# Patient Record
Sex: Female | Born: 1955 | Race: White | Hispanic: No | Marital: Married | Smoking: Never smoker
Health system: Southern US, Community
[De-identification: ages and names within clinical notes are randomized; demographics above are authoritative.]

## PROBLEM LIST (undated history)

## (undated) DIAGNOSIS — J189 Pneumonia, unspecified organism: Secondary | ICD-10-CM

## (undated) DIAGNOSIS — N390 Urinary tract infection, site not specified: Secondary | ICD-10-CM

## (undated) HISTORY — DX: Pneumonia, unspecified organism: J18.9

## (undated) HISTORY — DX: Urinary tract infection, site not specified: N39.0

---

## 1997-02-26 DIAGNOSIS — J189 Pneumonia, unspecified organism: Secondary | ICD-10-CM

## 1997-02-26 HISTORY — DX: Pneumonia, unspecified organism: J18.9

## 2005-11-26 HISTORY — PX: BUNIONECTOMY: SHX129

## 2007-03-05 ENCOUNTER — Other Ambulatory Visit: Admission: RE | Admit: 2007-03-05 | Discharge: 2007-03-05 | Payer: Self-pay | Admitting: Family Medicine

## 2007-03-12 ENCOUNTER — Ambulatory Visit: Payer: Self-pay | Admitting: Family Medicine

## 2007-03-12 DIAGNOSIS — N12 Tubulo-interstitial nephritis, not specified as acute or chronic: Secondary | ICD-10-CM | POA: Insufficient documentation

## 2007-03-12 LAB — CONVERTED CEMR LAB
Bilirubin Urine: NEGATIVE
Ketones, urine, test strip: NEGATIVE
Nitrite: NEGATIVE
Specific Gravity, Urine: 1.01

## 2007-03-14 ENCOUNTER — Telehealth: Payer: Self-pay | Admitting: *Deleted

## 2007-03-14 LAB — CONVERTED CEMR LAB
EBV VCA IgG: 3.79 — ABNORMAL HIGH
EBV VCA IgM: 0.84

## 2007-03-20 ENCOUNTER — Encounter: Payer: Self-pay | Admitting: Family Medicine

## 2007-04-10 ENCOUNTER — Encounter: Payer: Self-pay | Admitting: Family Medicine

## 2007-05-02 ENCOUNTER — Encounter: Payer: Self-pay | Admitting: Family Medicine

## 2007-05-14 ENCOUNTER — Ambulatory Visit (HOSPITAL_COMMUNITY): Admission: RE | Admit: 2007-05-14 | Discharge: 2007-05-14 | Payer: Self-pay | Admitting: Urology

## 2007-10-08 ENCOUNTER — Telehealth: Payer: Self-pay | Admitting: Family Medicine

## 2007-10-10 ENCOUNTER — Ambulatory Visit: Payer: Self-pay | Admitting: Family Medicine

## 2007-10-10 LAB — CONVERTED CEMR LAB
Nitrite: NEGATIVE
Specific Gravity, Urine: 1.02
Urobilinogen, UA: 0.2

## 2007-10-11 ENCOUNTER — Encounter: Payer: Self-pay | Admitting: Family Medicine

## 2007-10-17 ENCOUNTER — Ambulatory Visit: Payer: Self-pay | Admitting: Family Medicine

## 2007-10-17 DIAGNOSIS — N3000 Acute cystitis without hematuria: Secondary | ICD-10-CM | POA: Insufficient documentation

## 2007-10-17 LAB — CONVERTED CEMR LAB
Bilirubin Urine: NEGATIVE
Ketones, urine, test strip: NEGATIVE
Nitrite: NEGATIVE
Protein, U semiquant: NEGATIVE
Urobilinogen, UA: 0.2

## 2008-10-08 ENCOUNTER — Ambulatory Visit: Payer: Self-pay | Admitting: Family Medicine

## 2008-10-08 DIAGNOSIS — M2240 Chondromalacia patellae, unspecified knee: Secondary | ICD-10-CM

## 2008-10-18 ENCOUNTER — Telehealth: Payer: Self-pay | Admitting: Family Medicine

## 2008-10-18 DIAGNOSIS — M25569 Pain in unspecified knee: Secondary | ICD-10-CM

## 2008-10-19 ENCOUNTER — Telehealth: Payer: Self-pay | Admitting: Family Medicine

## 2008-10-26 ENCOUNTER — Encounter: Payer: Self-pay | Admitting: Family Medicine

## 2008-12-10 ENCOUNTER — Encounter: Payer: Self-pay | Admitting: Family Medicine

## 2008-12-15 ENCOUNTER — Encounter: Payer: Self-pay | Admitting: Family Medicine

## 2008-12-31 ENCOUNTER — Ambulatory Visit (HOSPITAL_BASED_OUTPATIENT_CLINIC_OR_DEPARTMENT_OTHER): Admission: RE | Admit: 2008-12-31 | Discharge: 2008-12-31 | Payer: Self-pay | Admitting: Specialist

## 2009-01-06 ENCOUNTER — Encounter: Payer: Self-pay | Admitting: Family Medicine

## 2009-01-10 ENCOUNTER — Encounter (INDEPENDENT_AMBULATORY_CARE_PROVIDER_SITE_OTHER): Payer: Self-pay | Admitting: *Deleted

## 2009-04-22 IMAGING — NM NM VCUG
2 series · 7 of 7 positions shown · non-contrast
Comparison: none

CLINICAL DATA: Left renal atrophy, ureterocele.

[Series 1: dy vcug · 9.39mm/px · 6 of 63 frames shown]
[frame 6/63]
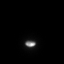
[frame 16/63]
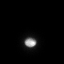
[frame 27/63]
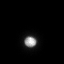
[frame 37/63]
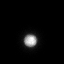
[frame 48/63]
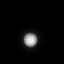
[frame 58/63]
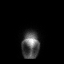

[Series 1: st static image · 1 of 1 slices shown]
[im 1/1]
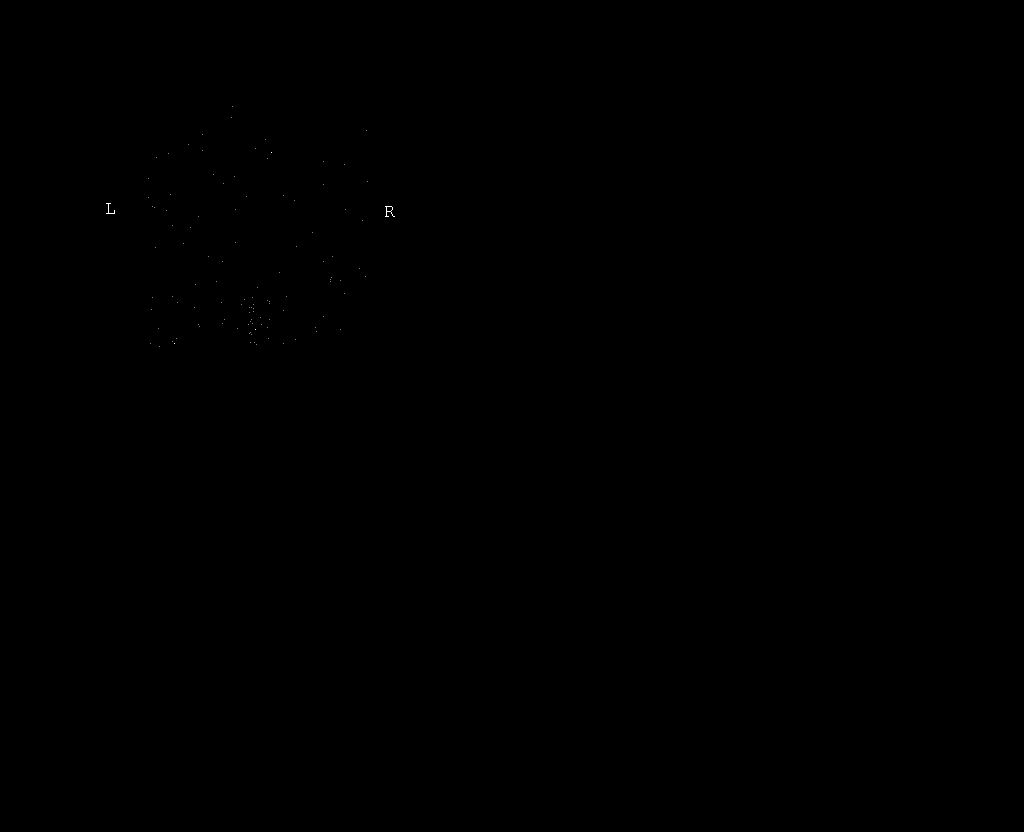

[7 of 7 positions shown; findings below may reference images not displayed]

Nuclear medicine voiding cystourethrogram:

Posterior imaging during filling of the urinary bladder with 1 mCi LcLLm
pertechnetate in 6576 ml normal saline. Imaging continued when the patient
voided.
There is normal activity in the bladder. No evidence of vesicoureteral reflux.
No significant post void residual.
IMPRESSION: 1. Negative for vesicoureteral reflux

## 2009-09-07 ENCOUNTER — Ambulatory Visit: Payer: Self-pay | Admitting: Family Medicine

## 2009-09-07 DIAGNOSIS — M719 Bursopathy, unspecified: Secondary | ICD-10-CM

## 2009-09-07 DIAGNOSIS — M67919 Unspecified disorder of synovium and tendon, unspecified shoulder: Secondary | ICD-10-CM | POA: Insufficient documentation

## 2009-10-18 ENCOUNTER — Telehealth: Payer: Self-pay | Admitting: Family Medicine

## 2009-12-08 ENCOUNTER — Encounter: Payer: Self-pay | Admitting: Family Medicine

## 2009-12-09 ENCOUNTER — Encounter: Payer: Self-pay | Admitting: Family Medicine

## 2010-02-06 ENCOUNTER — Encounter: Payer: Self-pay | Admitting: Family Medicine

## 2010-03-28 NOTE — Progress Notes (Signed)
Summary: referral  Phone Note Call from Patient   Caller: Patient Call For: Nelwyn Salisbury MD Summary of Call: Requesting a referral for shoulder pain. [phone] Initial call taken by: Lynann Beaver CMA,  October 18, 2009 4:47 PM  Follow-up for Phone Call        refer to Dr. Jene Every for left shoulder pain Follow-up by: Nelwyn Salisbury MD,  October 19, 2009 3:08 PM

## 2010-03-28 NOTE — Assessment & Plan Note (Signed)
Summary: SHOULDER PAIN // RS   Vital Signs:  Patient profile:   55 year old female Weight:      155 pounds BP sitting:   116 / 74  (left arm) Cuff size:   regular  Vitals Entered By: Raechel Ache, RN (September 07, 2009 4:22 PM) CC: C/o L shoulder pain x 1 month.   History of Present Illness: Here for the onset one month ago of intermittent pains in the left shoulder with no known trauma hx. She does lift light weights (5 lbs) from time to time but nothng heavy. She runs and plays golf. The pains are centered in the lateral part of the shoulder and the posterior areas. The pains radiate doen the upper arm halfway to the elbow but no farther. No neck or back issues. She has tried [employer] a few times with mixed results.   Allergies: No Known Drug Allergies  Past History:  Past Medical History: Reviewed history from 03/12/2007 and no changes required. pneumonia 1999 frequent UTI's  Past Surgical History: Reviewed history from 03/12/2007 and no changes required. Bunion surgery 10/07  Review of Systems  The patient denies anorexia, fever, weight loss, weight gain, vision loss, decreased hearing, hoarseness, chest pain, syncope, dyspnea on exertion, peripheral edema, prolonged cough, headaches, hemoptysis, abdominal pain, melena, hematochezia, severe indigestion/heartburn, hematuria, incontinence, genital sores, muscle weakness, suspicious skin lesions, transient blindness, difficulty walking, depression, unusual weight change, abnormal bleeding, enlarged lymph nodes, angioedema, breast masses, and testicular masses.    Physical Exam  General:  Well-developed,well-nourished,in no acute distress; alert,appropriate and cooperative throughout examination Msk:  mildly tender in the posterior left shoulder and laterally over the deltoid  bursa. The shoulder has full ROM. No crepotus. Abduction against resistance causes mild pain. No weakness    Impression & Recommendations:  Problem # 1:   BURSITIS, LEFT SHOULDER (ICD-726.10)  Complete Medication List: 1)  Etodolac 500 Mg Tabs (Etodolac) .... Two times a day as needed pain 2)  Cipro 500 Mg Tabs (Ciprofloxacin hcl) .... One after intercourse 3)  Hydrocodone-acetaminophen 5-500 Mg Tabs (Hydrocodone-acetaminophen) .Marland Kitchen.. 1 by mouth every 6 hours as needed for pain  Patient Instructions: 1)  This is consistent with deltoid bursitis. Advised her to rest and not do any type of upper body exercises for one month. Use ice packs. use Dicolenac to reduce inflammation.  2)  Please schedule a follow-up appointment as needed .  Prescriptions: CIPRO 500 MG TABS (CIPROFLOXACIN HCL) one after intercourse  #60 x 11   Entered and Authorized by:   Nelwyn Salisbury MD   Signed by:   Nelwyn Salisbury MD on 09/07/2009   Method used:   Electronically to        [facility] # [phone]* (retail)       [address]       [place], [place]  [address]       Ph: [phone]       Fax: [phone]   RxID:   [id number] ETODOLAC 500 MG TABS (ETODOLAC) two times a day as needed pain  #60 x 5   Entered and Authorized by:   Nelwyn Salisbury MD   Signed by:   Nelwyn Salisbury MD on 09/07/2009   Method used:   Electronically to        [facility] # [phone]* (retail)       [address]       [place], [place]  [address]  Ph: [phone]       Fax: [phone]   RxID:   [id number]

## 2010-03-28 NOTE — Miscellaneous (Signed)
Summary: Flu Shot/[facility]  [facility]   Imported By: Maryln Gottron 12/14/2009 11:10:43  _____________________________________________________________________  External Attachment:    Type:   Image     Comment:   External Document

## 2010-03-28 NOTE — Miscellaneous (Signed)
Summary: flu  injection [facility]   Clinical Lists Changes  Observations: Added new observation of FLU VAX: Historical (12/08/2009 15:32)      Immunization History:  Influenza Immunization History:    Influenza:  historical (12/08/2009) pt [facility] lot number [id number]....gh rn...........................Marland Kitchen

## 2010-03-30 NOTE — Letter (Signed)
Summary: [facility]  [facility]   Imported By: Maryln Gottron 02/09/2010 14:51:54  _____________________________________________________________________  External Attachment:    Type:   Image     Comment:   External Document

## 2010-05-31 LAB — POCT HEMOGLOBIN-HEMACUE: Hemoglobin: 13.4 g/dL (ref 12.0–15.0)

## 2010-07-11 ENCOUNTER — Other Ambulatory Visit: Payer: Self-pay | Admitting: Family Medicine

## 2010-07-11 NOTE — Telephone Encounter (Signed)
Pt is out of town and forgot her antibiotic Cipro ?500mg . Pt req that this med be called in to [facility] on [address] in [place] [phone]

## 2010-07-11 NOTE — Telephone Encounter (Signed)
Call in Cipro 500 mg to take one after intercourse, #60 with 5 rf

## 2010-07-12 MED ORDER — CIPROFLOXACIN HCL 500 MG PO TABS: ORAL_TABLET | ORAL | Status: DC

## 2010-08-25 ENCOUNTER — Encounter: Payer: Self-pay | Admitting: Family Medicine

## 2010-08-25 ENCOUNTER — Ambulatory Visit (INDEPENDENT_AMBULATORY_CARE_PROVIDER_SITE_OTHER): Payer: BC Managed Care – PPO | Admitting: Family Medicine

## 2010-08-25 VITALS — BP 112/80 | Temp 98.0°F | Ht 69.0 in | Wt 154.0 lb

## 2010-08-25 DIAGNOSIS — B029 Zoster without complications: Secondary | ICD-10-CM

## 2010-08-25 MED ORDER — CIPROFLOXACIN HCL 500 MG PO TABS: ORAL_TABLET | ORAL | Status: DC

## 2010-08-25 MED ORDER — PREDNISONE (PAK) 10 MG PO TABS: 10.0000 mg | ORAL_TABLET | Freq: Every day | ORAL | Status: AC

## 2010-08-25 MED ORDER — HYDROCODONE-ACETAMINOPHEN 5-325 MG PO TABS: 1.0000 | ORAL_TABLET | Freq: Four times a day (QID) | ORAL | Status: AC | PRN

## 2010-08-25 MED ORDER — VALACYCLOVIR HCL 1 G PO TABS: 1000.0000 mg | ORAL_TABLET | Freq: Three times a day (TID) | ORAL | Status: AC

## 2010-08-25 NOTE — Progress Notes (Signed)
  Subjective:    Patient ID: Nancy Scott, female    DOB: [birth year], 55 y.o.   MRN: [id number]  HPI Here for 5 days of a slight burning and tingling sensation on the left side of the back of her head, also some mild left ear pain and a few small swollen lymph nodes in the left posterior neck. No fevers    Review of Systems  Constitutional: Negative.   Skin: Negative.   Neurological: Positive for headaches.       Objective:   Physical Exam  Constitutional: She appears well-developed and well-nourished.  Neck:       Several tiny enlarged left posterior nodes   Skin:       There is a single small macular area of erythema on the left occipital scalp          Assessment & Plan:  This is probably early shingles. Treat with Valtrex and Prednisone.

## 2011-05-04 ENCOUNTER — Telehealth: Payer: Self-pay | Admitting: General Practice

## 2011-05-04 MED ORDER — CIPROFLOXACIN HCL 500 MG PO TABS: ORAL_TABLET | ORAL | Status: DC

## 2011-05-04 NOTE — Telephone Encounter (Signed)
Script was approved by Dr. Clent Ridges and I send script e-scribe, also spoke with pt.

## 2011-05-04 NOTE — Telephone Encounter (Signed)
Needs Cipro refill but due to change in insurance needs 90 day supply. [facility] [place] on [address]. Pt states she is on last day of pill.

## 2012-05-24 ENCOUNTER — Other Ambulatory Visit: Payer: Self-pay | Admitting: Family Medicine

## 2012-05-26 NOTE — Telephone Encounter (Signed)
Pt needs refill cipro 500 mg

## 2012-08-19 ENCOUNTER — Encounter: Payer: Self-pay | Admitting: Family Medicine

## 2012-08-19 ENCOUNTER — Ambulatory Visit (INDEPENDENT_AMBULATORY_CARE_PROVIDER_SITE_OTHER): Payer: BC Managed Care – PPO | Admitting: Family Medicine

## 2012-08-19 VITALS — BP 114/70 | HR 98 | Temp 98.0°F | Wt 149.0 lb

## 2012-08-19 DIAGNOSIS — N390 Urinary tract infection, site not specified: Secondary | ICD-10-CM

## 2012-08-19 LAB — POCT URINALYSIS DIPSTICK
Ketones, UA: NEGATIVE
Nitrite, UA: NEGATIVE
Urobilinogen, UA: 0.2
pH, UA: 6

## 2012-08-19 MED ORDER — SULFAMETHOXAZOLE-TRIMETHOPRIM 800-160 MG PO TABS: 1.0000 | ORAL_TABLET | Freq: Two times a day (BID) | ORAL | Status: DC

## 2012-08-19 MED ORDER — NITROFURANTOIN MONOHYD MACRO 100 MG PO CAPS: 100.0000 mg | ORAL_CAPSULE | Freq: Two times a day (BID) | ORAL | Status: DC

## 2012-08-19 NOTE — Progress Notes (Signed)
  Subjective:    Patient ID: Nancy Scott, female    DOB: Mar 28, 1955, 57 y.o.   MRN: [id number]  HPI Here for 3 days of fevers, chills, and mild nausea. No urinary urgency or burning. She has taken Cipro bid for the past 3 days. Drinking plenty of water.    Review of Systems  Constitutional: Positive for fever and chills. Negative for diaphoresis.  HENT: Negative.   Respiratory: Negative.   Cardiovascular: Negative.   Gastrointestinal: Negative.   Genitourinary: Negative.        Objective:   Physical Exam  Constitutional: She appears well-developed and well-nourished. No distress.  Cardiovascular: Normal rate, regular rhythm, normal heart sounds and intact distal pulses.   Pulmonary/Chest: Effort normal and breath sounds normal.  Abdominal: Soft. Bowel sounds are normal. She exhibits no distension and no mass. There is no tenderness. There is no rebound and no guarding.          Assessment & Plan:  Treat with Bactrim DS. Culture the sample

## 2012-08-19 NOTE — Addendum Note (Signed)
Addended by: Aniceto Boss A on: 08/19/2012 02:28 PM   Modules accepted: Orders

## 2012-08-19 NOTE — Addendum Note (Signed)
Addended by: Gershon Crane A on: 08/19/2012 01:53 PM   Modules accepted: Orders, Medications

## 2012-08-22 LAB — URINE CULTURE: Colony Count: >=100000

## 2012-08-22 NOTE — Progress Notes (Signed)
Quick Note:  I spoke with pt ______ 

## 2012-09-01 ENCOUNTER — Telehealth: Payer: Self-pay | Admitting: Family Medicine

## 2012-09-01 NOTE — Telephone Encounter (Signed)
Tell her to stay on the Cipro. If she needs a new rx, call in Cipro 500 mg bid for 10 days. Also call in Pyridium 200 mg to take tid prn urinary pain, #60 with 2 rf

## 2012-09-01 NOTE — Telephone Encounter (Signed)
Patient Information:  Caller Name: Nancy Scott  Phone: [phone]  Patient: Nancy Scott, Nancy Scott  Gender: Female  DOB: 21-Nov-1955  Age: 57 Years  PCP: Gershon Crane [facility])  Pregnant: No  Office Follow Up:  Does the office need to follow up with this patient?: Yes  Instructions For The Office: Office please follow up with patient regarding new antibiotic and pain medication.  [facility] [place]  RN Note:  RN pulled up the urine culture and saw where culture was resistant to Cipro.  Pt states she needs something for the infection and for the pain  Symptoms  Reason For Call & Symptoms: pt was in the office to see Dr Clent Ridges on 08/19/12.  Pt was prescribed a new medication, Macrobid.  Pt states that now she feels worse.  Pt states she normally has RX for Cipro for on going prescription.  Pt states that she started taking Cipro over the weekend .  Pt wants to know if she should continue the Cipro or try a new antibiotic.  Pt is now having back pain (which is different than before)  Reviewed Health History In EMR: Yes  Reviewed Medications In EMR: Yes  Reviewed Allergies In EMR: Yes  Reviewed Surgeries / Procedures: Yes  Date of Onset of Symptoms: 08/16/2012 OB / GYN:  LMP: Unknown  Guideline(s) Used:  No Protocol Available - Sick Adult  Disposition Per Guideline:   Discuss with PCP and Callback by Nurse Today  Reason For Disposition Reached:   Nursing judgment  Advice Given:  N/A  Patient Will Follow Care Advice:  YES

## 2012-09-02 NOTE — Telephone Encounter (Signed)
I spoke with pt and she is not having any urinary pain, declined the script for Pyridium. She is requesting something for back pain. Also she still has a supply of Cipro and will continue on with this.

## 2012-09-03 MED ORDER — HYDROCODONE-ACETAMINOPHEN 5-325 MG PO TABS: 1.0000 | ORAL_TABLET | Freq: Four times a day (QID) | ORAL | Status: AC | PRN

## 2012-09-03 NOTE — Telephone Encounter (Signed)
Call in Vicodin 5/325 to take q 6 hours prn pain, #60 with 2 rf

## 2012-09-03 NOTE — Telephone Encounter (Signed)
I called in script and left voice message for pt. 

## 2013-01-07 ENCOUNTER — Encounter: Payer: Self-pay | Admitting: Family Medicine

## 2013-01-07 ENCOUNTER — Ambulatory Visit (INDEPENDENT_AMBULATORY_CARE_PROVIDER_SITE_OTHER): Payer: BC Managed Care – PPO | Admitting: Family Medicine

## 2013-01-07 VITALS — BP 124/80 | HR 62 | Temp 98.0°F | Wt 154.0 lb

## 2013-01-07 DIAGNOSIS — G5600 Carpal tunnel syndrome, unspecified upper limb: Secondary | ICD-10-CM

## 2013-01-07 DIAGNOSIS — Z23 Encounter for immunization: Secondary | ICD-10-CM

## 2013-01-07 NOTE — Progress Notes (Signed)
Pre visit review using our clinic review tool, if applicable. No additional management support is needed unless otherwise documented below in the visit note. 

## 2013-01-07 NOTE — Progress Notes (Signed)
  Subjective:    Patient ID: Nancy Scott, female    DOB: 08-21-1955, 57 y.o.   MRN: [id number]  HPI Here for 3 months of intermittent numbness and tingling in both hands. This is worse at night and it often wakes her up during the night. It improves after she shakes her hands or moves them around. She has milder versions of this during the day also. No pain or weakness of the hands. No neck problems. No feet sx.    Review of Systems  Constitutional: Negative.   Neurological: Positive for numbness. Negative for dizziness, tremors, seizures, syncope, facial asymmetry, speech difficulty, weakness, light-headedness and headaches.       Objective:   Physical Exam  Constitutional: She is oriented to person, place, and time. She appears well-developed and well-nourished.  Musculoskeletal: Normal range of motion. She exhibits no edema and no tenderness.  Negative Tinels and Phalens  Neurological: She is alert and oriented to person, place, and time. She displays normal reflexes. No cranial nerve deficit. She exhibits normal muscle tone. Coordination normal.          Assessment & Plan:  Probable carpal tunnel syndrome. Wear wrist splints in bed every night. Try Aleve bid

## 2013-07-07 ENCOUNTER — Telehealth: Payer: Self-pay | Admitting: Family Medicine

## 2013-07-07 NOTE — Telephone Encounter (Signed)
Pt rx on cipro // pt said it is a 5490 rx   Pharmacy ; [facility] [place]

## 2013-07-08 NOTE — Telephone Encounter (Signed)
Call in Cipro 500 mg to take #1 after intercourse, #90 with 3 rf

## 2013-07-09 MED ORDER — CIPROFLOXACIN HCL 500 MG PO TABS: 500.0000 mg | ORAL_TABLET | Freq: Once | ORAL | Status: DC

## 2013-07-09 NOTE — Telephone Encounter (Signed)
I sent script e-scribe. 

## 2014-03-09 ENCOUNTER — Ambulatory Visit (INDEPENDENT_AMBULATORY_CARE_PROVIDER_SITE_OTHER): Payer: BLUE CROSS/BLUE SHIELD | Admitting: Family Medicine

## 2014-03-09 ENCOUNTER — Encounter: Payer: Self-pay | Admitting: Family Medicine

## 2014-03-09 VITALS — BP 126/77 | HR 62 | Temp 98.2°F | Ht 69.0 in | Wt 155.0 lb

## 2014-03-09 DIAGNOSIS — N3000 Acute cystitis without hematuria: Secondary | ICD-10-CM

## 2014-03-09 LAB — POCT URINALYSIS DIPSTICK
Bilirubin, UA: NEGATIVE
GLUCOSE UA: NEGATIVE
Ketones, UA: NEGATIVE
Nitrite, UA: NEGATIVE
UROBILINOGEN UA: 0.2
pH, UA: 6

## 2014-03-09 MED ORDER — CEPHALEXIN 500 MG PO CAPS: 500.0000 mg | ORAL_CAPSULE | Freq: Three times a day (TID) | ORAL | Status: DC

## 2014-03-09 NOTE — Addendum Note (Signed)
Addended by: Aniceto BossNIMMONS, SYLVIA A on: 03/09/2014 12:48 PM   Modules accepted: Orders

## 2014-03-09 NOTE — Progress Notes (Signed)
   Subjective:    Patient ID: Nancy PicketSusanne Scott, female    DOB: September 03, 1955, 59 y.o.   MRN: [id number]  HPI Here for one week of low back pain and urgency to urinate. No burning or fever. She has been taking Cipro prophylactically.    Review of Systems  Constitutional: Negative.   Genitourinary: Positive for urgency and frequency. Negative for dysuria and hematuria.       Objective:   Physical Exam  Constitutional: She appears well-developed and well-nourished.  Abdominal: Soft. Bowel sounds are normal. She exhibits no distension and no mass. There is no tenderness. There is no rebound and no guarding.          Assessment & Plan:  Switch to Keflex 500 mg tid. Culture the sample

## 2014-03-09 NOTE — Progress Notes (Signed)
Pre visit review using our clinic review tool, if applicable. No additional management support is needed unless otherwise documented below in the visit note. 

## 2014-03-12 LAB — URINE CULTURE: Colony Count: >=100000

## 2014-03-31 ENCOUNTER — Telehealth: Payer: Self-pay | Admitting: Family Medicine

## 2014-03-31 MED ORDER — CEPHALEXIN 500 MG PO CAPS: 500.0000 mg | ORAL_CAPSULE | Freq: Three times a day (TID) | ORAL | Status: AC

## 2014-03-31 NOTE — Telephone Encounter (Signed)
Pt was seen on 03-09-14 and would like another round of abx cephalexin 500 mg [facility] [place] [phone]--[phone]. Pt lives in [place] now

## 2014-03-31 NOTE — Telephone Encounter (Signed)
Per Dr. Clent RidgesFry call in tid # 30.

## 2014-03-31 NOTE — Telephone Encounter (Signed)
I called in script and left a voice message for pt. 

## 2014-07-20 ENCOUNTER — Telehealth: Payer: Self-pay

## 2014-07-20 NOTE — Telephone Encounter (Signed)
Left message on personally identified voicemail for pt to call back concerning need for mammogram
